# Patient Record
Sex: Male | Born: 1985 | Race: Black or African American | Hispanic: No | Marital: Single | Smoking: Never smoker
Health system: Southern US, Community
[De-identification: ages and names within clinical notes are randomized; demographics above are authoritative.]

---

## 2018-09-28 ENCOUNTER — Emergency Department (HOSPITAL_BASED_OUTPATIENT_CLINIC_OR_DEPARTMENT_OTHER)
Admission: EM | Admit: 2018-09-28 | Discharge: 2018-09-28 | Disposition: A | Discharge status: Home / Self Care | Payer: No Typology Code available for payment source | Attending: Emergency Medicine | Admitting: Emergency Medicine

## 2018-09-28 ENCOUNTER — Emergency Department (HOSPITAL_BASED_OUTPATIENT_CLINIC_OR_DEPARTMENT_OTHER): Payer: No Typology Code available for payment source

## 2018-09-28 ENCOUNTER — Encounter (HOSPITAL_BASED_OUTPATIENT_CLINIC_OR_DEPARTMENT_OTHER): Payer: Self-pay

## 2018-09-28 ENCOUNTER — Other Ambulatory Visit: Payer: Self-pay

## 2018-09-28 DIAGNOSIS — W230XXA Caught, crushed, jammed, or pinched between moving objects, initial encounter: Secondary | ICD-10-CM | POA: Diagnosis not present

## 2018-09-28 DIAGNOSIS — Y999 Unspecified external cause status: Secondary | ICD-10-CM | POA: Diagnosis not present

## 2018-09-28 DIAGNOSIS — S9031XA Contusion of right foot, initial encounter: Secondary | ICD-10-CM | POA: Insufficient documentation

## 2018-09-28 DIAGNOSIS — Y929 Unspecified place or not applicable: Secondary | ICD-10-CM | POA: Insufficient documentation

## 2018-09-28 DIAGNOSIS — Y939 Activity, unspecified: Secondary | ICD-10-CM | POA: Insufficient documentation

## 2018-09-28 DIAGNOSIS — S99821A Other specified injuries of right foot, initial encounter: Secondary | ICD-10-CM | POA: Diagnosis present

## 2018-09-28 MED ORDER — NAPROXEN 500 MG PO TABS: 500.0000 mg | ORAL_TABLET | Freq: Two times a day (BID) | ORAL | 0 refills | Status: AC

## 2018-09-28 NOTE — ED Notes (Signed)
Ice applied

## 2018-09-28 NOTE — ED Triage Notes (Signed)
Pt states injury while at work, right foot wedged between pallet and forklift, unable to bear weight due to pain.  Occurred 1am.

## 2018-09-28 NOTE — ED Notes (Signed)
Pt verbalized understanding of dc instructions.

## 2018-09-28 NOTE — ED Provider Notes (Signed)
[facility] EMERGENCY DEPARTMENT Provider Note   CSN: [id number] Arrival date & time: 09/28/18  0950    History   Chief Complaint Chief Complaint  Patient presents with  . Foot Pain    HPI Victor Newman is a 33 y.o. male.     Patient is a 33 year old male who complains of pain in his right foot.  He was at work and his foot got crushed between a pallet and the pallet jack.  Both of his feet were initially crushed but his left foot was released.  He denies any discomfort to his left foot currently.  He denies any other injuries.  This happened about 1 AM this morning.  He has not been able to put weight on his right foot.     History reviewed. No pertinent past medical history.  There are no active problems to display for this patient.   History reviewed. No pertinent surgical history.      Home Medications    Prior to Admission medications   Medication Sig Start Date End Date Taking? Authorizing Provider  naproxen (NAPROSYN) 500 MG tablet Take 1 tablet (500 mg total) by mouth 2 (two) times daily. 09/28/18   Malvin Johns, MD    Family History History reviewed. No pertinent family history.  Social History Social History   Tobacco Use  . Smoking status: Never Smoker  . Smokeless tobacco: Never Used  Substance Use Topics  . Alcohol use: Not Currently  . Drug use: Never     Allergies   Sulfa antibiotics   Review of Systems Review of Systems  Constitutional: Negative for fever.  Gastrointestinal: Negative for nausea and vomiting.  Musculoskeletal: Positive for arthralgias and joint swelling. Negative for back pain and neck pain.  Skin: Negative for wound.  Neurological: Negative for weakness, numbness and headaches.     Physical Exam Updated Vital Signs BP 138/76 (BP Location: Right Arm)   Pulse 90   Temp 98.6 F (37 C) (Oral)   Resp 16   Ht 6\' 6"  (1.981 m)   Wt 99.8 kg   SpO2 97%   BMI 25.42 kg/m   Physical Exam Constitutional:       Appearance: He is well-developed.  HENT:     Head: Normocephalic and atraumatic.  Neck:     Musculoskeletal: Normal range of motion and neck supple.  Cardiovascular:     Rate and Rhythm: Normal rate.  Pulmonary:     Effort: Pulmonary effort is normal.  Musculoskeletal:        General: Tenderness present.     Comments: Positive tenderness to the midfoot portion of the right foot.  There is minimal swelling.  There is small superficial abrasion to the lateral aspect of the foot but no lacerations.  No foot to the ankle.  No foot to the proximal fibula.  He has no pain on palpation of the left foot or ankle.  He has normal sensation and motor function distally in the feet.  Pedal pulses are intact.  Skin:    General: Skin is warm and dry.  Neurological:     Mental Status: He is alert and oriented to person, place, and time.      ED Treatments / Results  Labs (all labs ordered are listed, but only abnormal results are displayed) Labs Reviewed - No data to display  EKG None  Radiology Dg Foot Complete Right  Result Date: 09/28/2018 CLINICAL DATA:  Crushing injury to right foot at work  today. Right mid foot pain. Initial encounter. EXAM: RIGHT FOOT COMPLETE - 3+ VIEW COMPARISON:  None. FINDINGS: There is no evidence of fracture or dislocation. There is no evidence of arthropathy or other focal bone abnormality. Soft tissues are unremarkable. IMPRESSION: Negative. Electronically Signed   By: Myles RosenthalJohn  Stahl M.D.   On: 09/28/2018 11:38    Procedures Procedures (including critical care time)  Medications Ordered in ED Medications - No data to display   Initial Impression / Assessment and Plan / ED Course  I have reviewed the triage vital signs and the nursing notes.  Pertinent labs & imaging results that were available during my care of the patient were reviewed by me and considered in my medical decision making (see chart for details).        Patient is a 33 year old male  who presents with pain in his right foot after it was wedged between a pallet and a forklift jack.  No bony injury is noted on imaging studies.  There is no significant swelling or concern for compartment syndrome.  He is neurologically intact.  He was placed in a postop shoe and given crutches.  He was advised in ice and elevation.  He was given prescription for Naprosyn.  He was given a referral to follow-up with orthopedics if his symptoms are not improving.  Return precautions were given.  Final Clinical Impressions(s) / ED Diagnoses   Final diagnoses:  Contusion of right foot, initial encounter    ED Discharge Orders         Ordered    naproxen (NAPROSYN) 500 MG tablet  2 times daily     09/28/18 1243           Rolan BuccoBelfi, Tiahna Cure, MD 09/28/18 1244

## 2020-04-23 IMAGING — DX RIGHT FOOT COMPLETE - 3+ VIEW
3 series · 3 of 3 positions shown · non-contrast
Comparison: None.

CLINICAL DATA: Crushing injury to right foot at work today. Right
mid foot pain. Initial encounter.

EXAM:
RIGHT FOOT COMPLETE - 3+ VIEW

[foot ap]
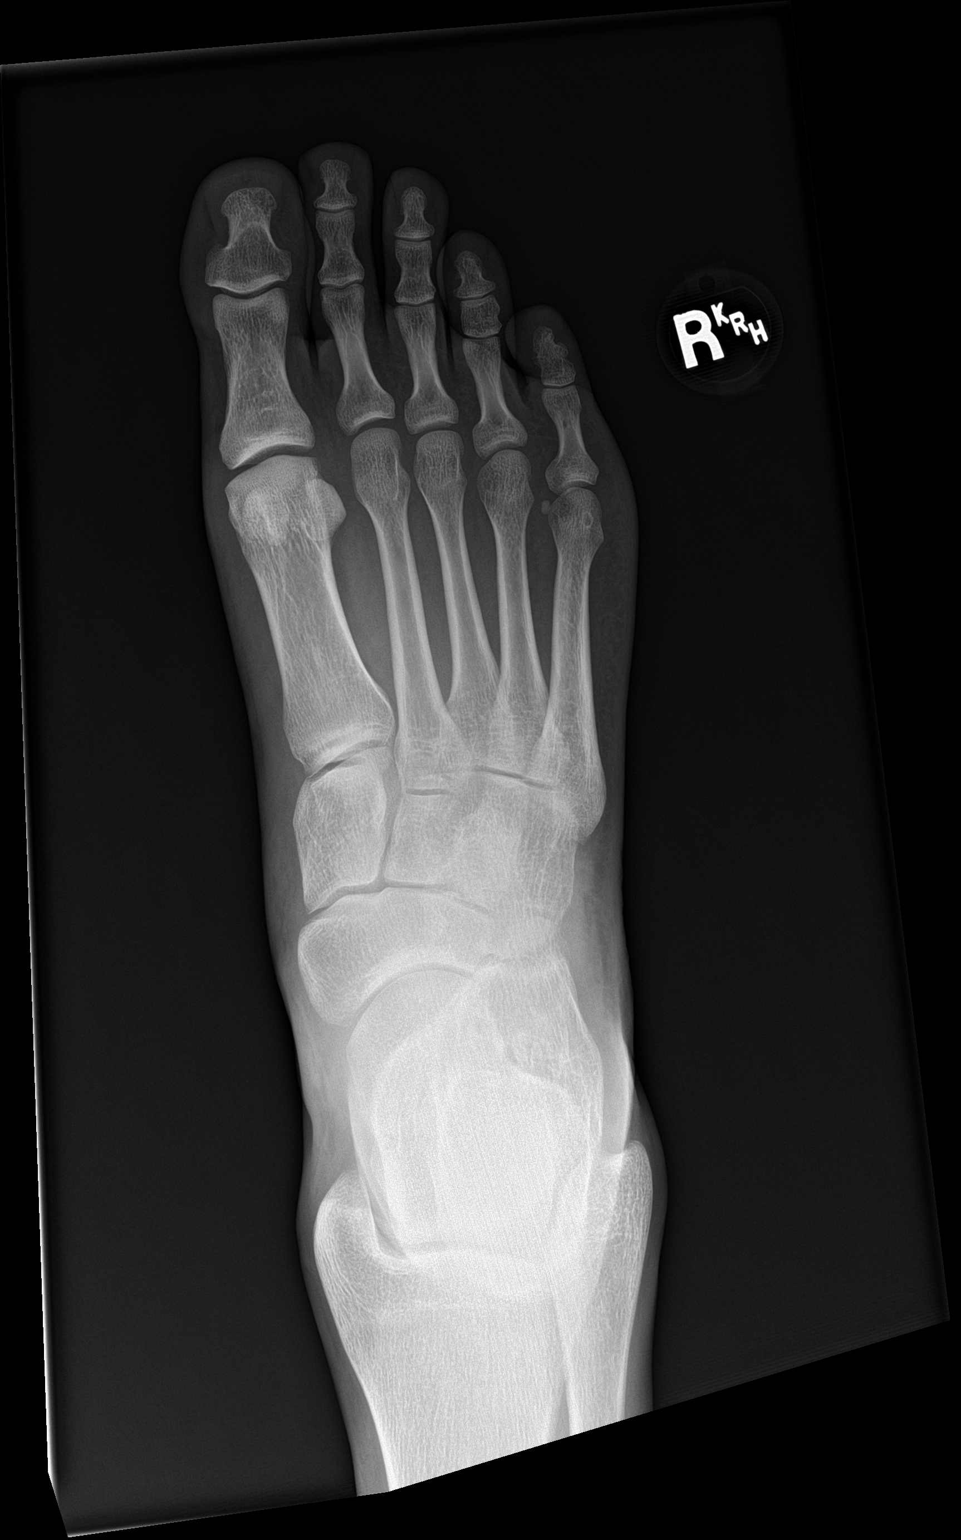

[foot obl]
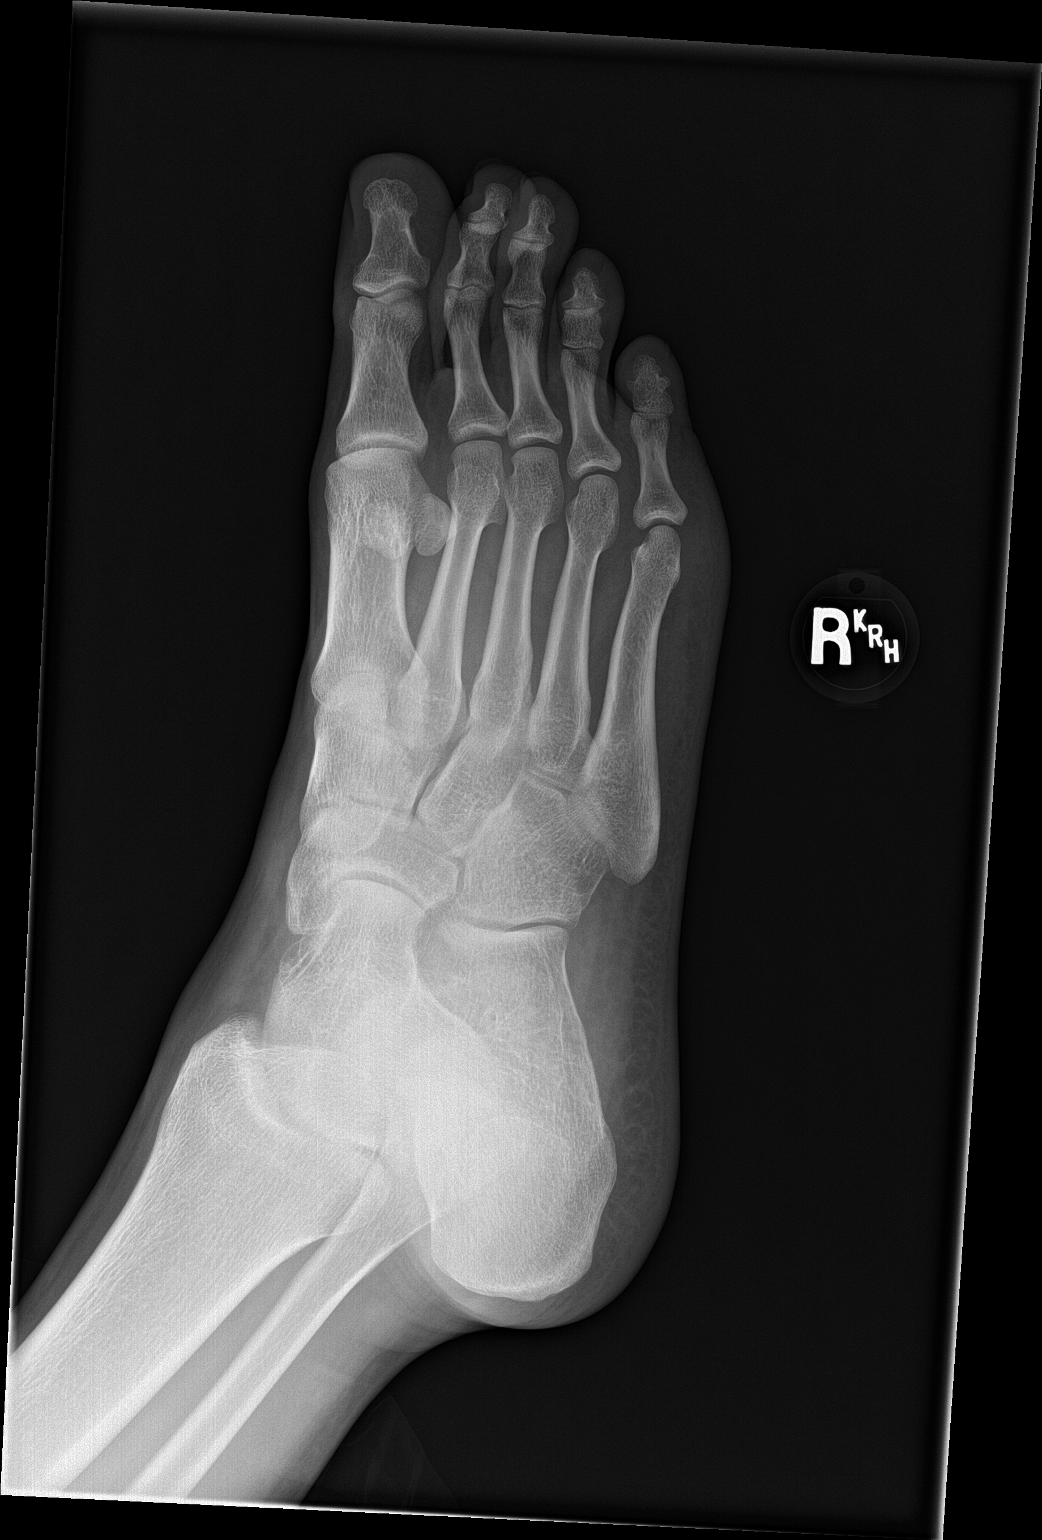

[foot lat]
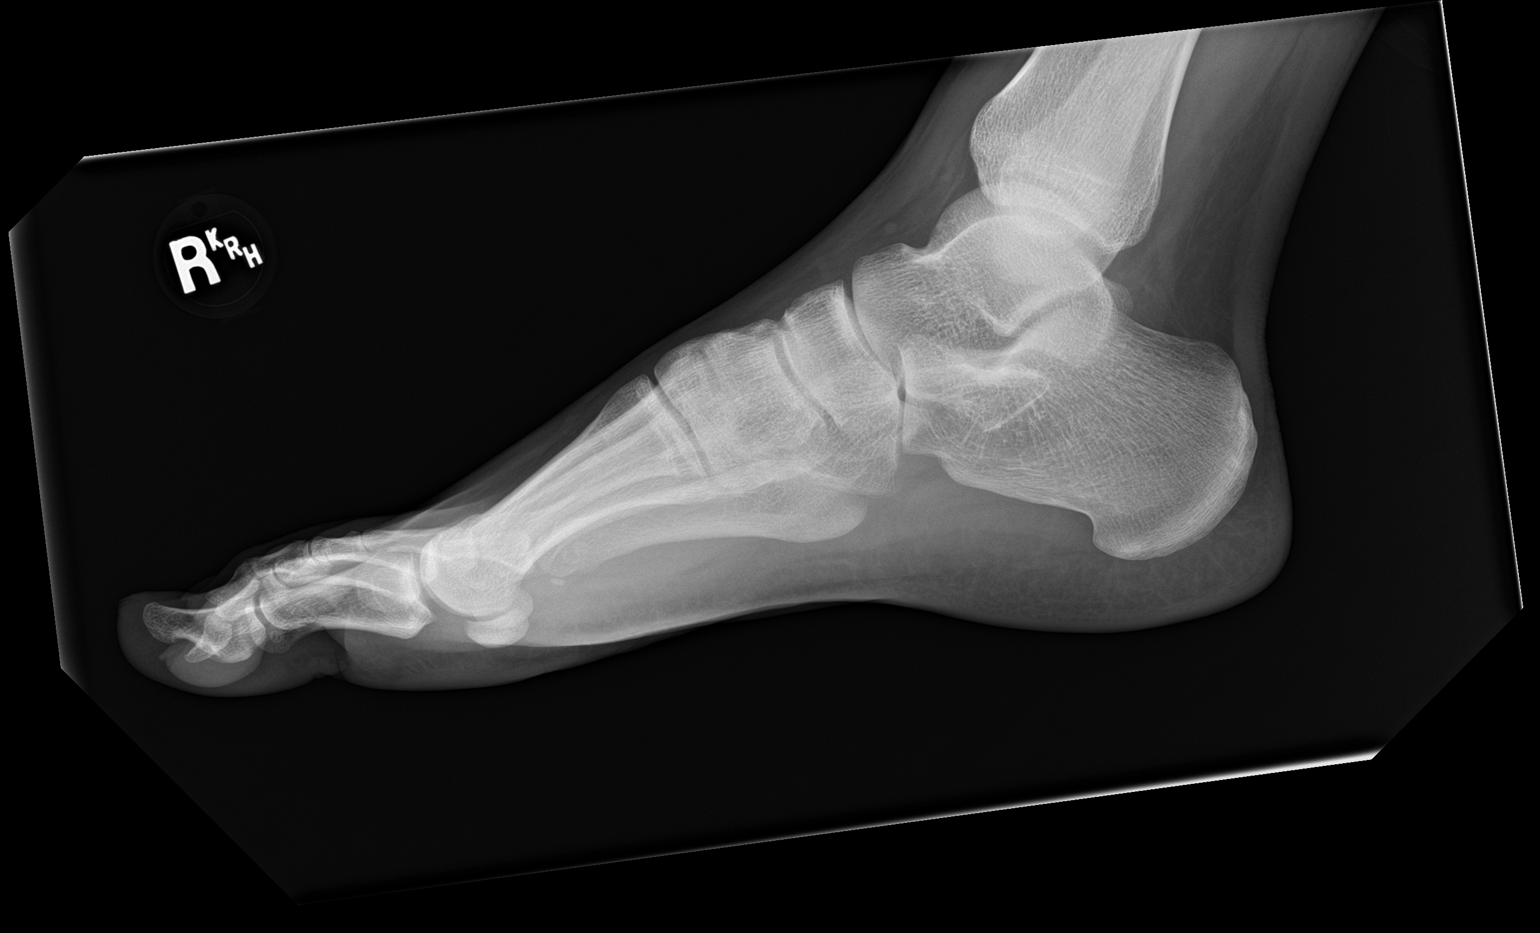

[3 of 3 positions shown; findings below may reference images not displayed]

FINDINGS: There is no evidence of fracture or dislocation. There is no
evidence of arthropathy or other focal bone abnormality. Soft
tissues are unremarkable.
IMPRESSION: Negative.
